# Patient Record
Sex: Male | Born: 1995 | Race: White | Hispanic: No | Marital: Single
Health system: Southern US, Community
[De-identification: ages and names within clinical notes are randomized; demographics above are authoritative.]

---

## 2016-10-25 ENCOUNTER — Emergency Department: Payer: Managed Care, Other (non HMO)

## 2016-10-25 ENCOUNTER — Encounter: Payer: Self-pay | Admitting: Radiology

## 2016-10-25 ENCOUNTER — Emergency Department
Admission: EM | Admit: 2016-10-25 | Discharge: 2016-10-25 | Disposition: A | Discharge status: Home / Self Care | Payer: Managed Care, Other (non HMO) | Attending: Emergency Medicine | Admitting: Emergency Medicine

## 2016-10-25 DIAGNOSIS — K529 Noninfective gastroenteritis and colitis, unspecified: Secondary | ICD-10-CM | POA: Insufficient documentation

## 2016-10-25 DIAGNOSIS — R1084 Generalized abdominal pain: Secondary | ICD-10-CM | POA: Diagnosis present

## 2016-10-25 LAB — URINALYSIS, COMPLETE (UACMP) WITH MICROSCOPIC
BACTERIA UA: NONE SEEN
BILIRUBIN URINE: NEGATIVE
GLUCOSE, UA: NEGATIVE mg/dL
HGB URINE DIPSTICK: NEGATIVE
KETONES UR: NEGATIVE mg/dL
LEUKOCYTES UA: NEGATIVE
NITRITE: NEGATIVE
PH: 7 (ref 5.0–8.0)
Protein, ur: NEGATIVE mg/dL
SPECIFIC GRAVITY, URINE: 1.024 (ref 1.005–1.030)
Squamous Epithelial / LPF: NONE SEEN

## 2016-10-25 LAB — CBC
HCT: 46.9 % (ref 40.0–52.0)
Hemoglobin: 16.3 g/dL (ref 13.0–18.0)
MCH: 30.7 pg (ref 26.0–34.0)
MCHC: 34.7 g/dL (ref 32.0–36.0)
MCV: 88.7 fL (ref 80.0–100.0)
Platelets: 185 10*3/uL (ref 150–440)
RBC: 5.29 MIL/uL (ref 4.40–5.90)
RDW: 12.7 % (ref 11.5–14.5)
WBC: 10.9 10*3/uL — AB (ref 3.8–10.6)

## 2016-10-25 LAB — LIPASE, BLOOD: LIPASE: 31 U/L (ref 11–51)

## 2016-10-25 LAB — COMPREHENSIVE METABOLIC PANEL
ALBUMIN: 4.8 g/dL (ref 3.5–5.0)
ALT: 19 U/L (ref 17–63)
ANION GAP: 7 (ref 5–15)
AST: 26 U/L (ref 15–41)
Alkaline Phosphatase: 67 U/L (ref 38–126)
BUN: 22 mg/dL — AB (ref 6–20)
CHLORIDE: 104 mmol/L (ref 101–111)
CO2: 29 mmol/L (ref 22–32)
Calcium: 9.3 mg/dL (ref 8.9–10.3)
Creatinine, Ser: 0.83 mg/dL (ref 0.61–1.24)
GFR calc Af Amer: >60 mL/min (ref >60)
Glucose, Bld: 107 mg/dL — ABNORMAL HIGH (ref 65–99)
POTASSIUM: 3.7 mmol/L (ref 3.5–5.1)
Sodium: 140 mmol/L (ref 135–145)
Total Bilirubin: 0.9 mg/dL (ref 0.3–1.2)
Total Protein: 7.8 g/dL (ref 6.5–8.1)

## 2016-10-25 LAB — TROPONIN I: Troponin I: <0.03 ng/mL (ref <0.03)

## 2016-10-25 MED ORDER — ONDANSETRON 4 MG PO TBDP: 4.0000 mg | ORAL_TABLET | Freq: Three times a day (TID) | ORAL | 0 refills | Status: AC | PRN

## 2016-10-25 MED ORDER — ONDANSETRON 4 MG PO TBDP
ORAL_TABLET | ORAL | Status: AC
  Filled 2016-10-25: qty 1

## 2016-10-25 MED ORDER — IOPAMIDOL (ISOVUE-300) INJECTION 61%
30.0000 mL | Freq: Once | INTRAVENOUS | Status: AC | PRN
  Administered 2016-10-25: 30 mL via ORAL

## 2016-10-25 MED ORDER — IOPAMIDOL (ISOVUE-300) INJECTION 61%
100.0000 mL | Freq: Once | INTRAVENOUS | Status: AC | PRN
  Administered 2016-10-25: 100 mL via INTRAVENOUS

## 2016-10-25 MED ORDER — ONDANSETRON HCL 4 MG/2ML IJ SOLN
4.0000 mg | Freq: Once | INTRAMUSCULAR | Status: AC
  Administered 2016-10-25: 4 mg via INTRAVENOUS

## 2016-10-25 MED ORDER — SODIUM CHLORIDE 0.9 % IV BOLUS (SEPSIS)
1000.0000 mL | Freq: Once | INTRAVENOUS | Status: AC
  Administered 2016-10-25: 1000 mL via INTRAVENOUS

## 2016-10-25 NOTE — ED Notes (Signed)
Patient reports that he has been around people sick but its been diarrhea not vomiting.

## 2016-10-25 NOTE — ED Notes (Signed)
Patient transported to X-ray 

## 2016-10-25 NOTE — ED Triage Notes (Signed)
Pt in with co abd pain x 1 hr, vomited at home and states vomited blood. Has had chills since then and feeling shaky, no hx of the same. No diarrhea, no dysuria, no hx of abd problems.

## 2016-10-25 NOTE — ED Provider Notes (Signed)
[facility] Emergency Department Provider Note   First MD Initiated Contact with Patient 10/25/16 0141     (approximate)  I have reviewed the triage vital signs and the nursing notes.   HISTORY  Chief Complaint Abdominal Pain    HPI Christopher Mooney is a 21 y.o. male presents with generalized abdominal discomfort 1 hour associated with vomiting. Patient denies any diarrhea no dysuria no fever. Patient states she's had episodes like this in the past that were secondary to gastroenteritis. Patient states his current pain score is 4 out of 10. Patient states after 3 episodes of vomiting up on his fourth episode he noted blood in the vomit that was bright red.   Past medical history Gastroenteritis There are no active problems to display for this patient.   Past surgical history None  Prior to Admission medications   Not on File    Allergies Augmentin [amoxicillin-pot clavulanate]  No family history on file.  Social History Social History  Substance Use Topics  . Smoking status: Not on file  . Smokeless tobacco: Not on file  . Alcohol use Not on file    Review of Systems Constitutional: No fever/chills Eyes: No visual changes. ENT: No sore throat. Cardiovascular: Denies chest pain. Respiratory: Denies shortness of breath. Gastrointestinal:Positive for abdominal pain and vomiting  Genitourinary: Negative for dysuria. Musculoskeletal: Negative for back pain. Skin: Negative for rash. Neurological: Negative for headaches, focal weakness or numbness.  10-point ROS otherwise negative.  ____________________________________________   PHYSICAL EXAM:  VITAL SIGNS: ED Triage Vitals  Enc Vitals Group     BP 10/25/16 0013 127/62     Pulse Rate 10/25/16 0012 86     Resp 10/25/16 0012 20     Temp 10/25/16 0012 98.2 F (36.8 C)     Temp src --      SpO2 10/25/16 0012 100 %     Weight 10/25/16 0012 140 lb (63.5 kg)     Height 10/25/16 0012  6' (1.829 m)     Head Circumference --      Peak Flow --      Pain Score 10/25/16 0012 9     Pain Loc --      Pain Edu? --      Excl. in GC? --     Constitutional: Alert and oriented. Well appearing and in no acute distress. Eyes: Conjunctivae are normal. PERRL. EOMI. Head: Atraumatic. Mouth/Throat: Mucous membranes are moist.  Oropharynx non-erythematous. Neck: No stridor.  No meningeal signs.  No cervical spine tenderness to palpation. Cardiovascular: Normal rate, regular rhythm. Good peripheral circulation. Grossly normal heart sounds. Respiratory: Normal respiratory effort.  No retractions. Lungs CTAB. Gastrointestinal: Right lower quadrant right upper quadrant and epigastric tenderness palpation. No distention.  Musculoskeletal: No lower extremity tenderness nor edema. No gross deformities of extremities. Neurologic:  Normal speech and language. No gross focal neurologic deficits are appreciated.  Skin:  Skin is warm, dry and intact. No rash noted. Psychiatric: Mood and affect are normal. Speech and behavior are normal.  ____________________________________________   LABS (all labs ordered are listed, but only abnormal results are displayed)  Labs Reviewed  CBC - Abnormal; Notable for the following:       Result Value   WBC 10.9 (*)    All other components within normal limits  COMPREHENSIVE METABOLIC PANEL - Abnormal; Notable for the following:    Glucose, Bld 107 (*)    BUN 22 (*)    All other  components within normal limits  URINALYSIS, COMPLETE (UACMP) WITH MICROSCOPIC - Abnormal; Notable for the following:    Color, Urine YELLOW (*)    APPearance CLEAR (*)    All other components within normal limits  LIPASE, BLOOD  TROPONIN I   ____________________________________________  EKG  ED ECG REPORT I, [place] N BROWN, the attending physician, personally viewed and interpreted this ECG.   Date: 10/25/2016  EKG Time: 12:25 AM  Rate: 77  Rhythm: Normal Sinus  rhythm  Axis: Normal  Intervals: Normal  ST&T Change: None  ____________________________________________  RADIOLOGY I, [place] N BROWN, personally viewed and evaluated these images (plain radiographs) as part of my medical decision making, as well as reviewing the written report by the radiologist.  Dg Chest 2 View  Result Date: 10/25/2016 CLINICAL DATA:  21 year old male with chest pain and generalized abdominal pain. EXAM: CHEST  2 VIEW COMPARISON:  None. FINDINGS: The heart size and mediastinal contours are within normal limits. Both lungs are clear. The visualized skeletal structures are unremarkable. IMPRESSION: No active cardiopulmonary disease. Electronically Signed   By: Elgie CollardArash  Radparvar M.D.   On: 10/25/2016 02:36   Ct Abdomen Pelvis W Contrast  Result Date: 10/25/2016 CLINICAL DATA:  21 year old male with chest pain and generalized abdominal pain. Hematemesis. EXAM: CT ABDOMEN AND PELVIS WITH CONTRAST TECHNIQUE: Multidetector CT imaging of the abdomen and pelvis was performed using the standard protocol following bolus administration of intravenous contrast. CONTRAST:  100mL ISOVUE-300 IOPAMIDOL (ISOVUE-300) INJECTION 61% COMPARISON:  Chest radiograph dated 10/25/2016 FINDINGS: Lower chest: The visualized lung bases are clear. No intra-abdominal free air.  Small free fluid within the pelvis. Hepatobiliary: No focal liver abnormality is seen. No gallstones, gallbladder wall thickening, or biliary dilatation. Pancreas: Unremarkable. No pancreatic ductal dilatation or surrounding inflammatory changes. Spleen: Normal in size without focal abnormality. Adrenals/Urinary Tract: Adrenal glands are unremarkable. Kidneys are normal, without renal calculi, focal lesion, or hydronephrosis. Bladder is unremarkable. Stomach/Bowel: The stomach is mildly distended. Oral contrast with air-fluid level noted within the stomach. No evidence of gastric outlet obstruction. There is mild air distention of  multiple loops of proximal small bowel measuring up to 3.2 cm in diameter. There is however opacification of the small bowel loops more distally. There is mild thickened appearance of loops of small bowel in the lower abdomen with mild engorgement of the associated mesentery. Findings most likely represent enteritis with associated degree of ileus. Correlation with clinical exam is recommended. There is no definite evidence of bowel obstruction at this time. Normal appendix. Vascular/Lymphatic: No significant vascular findings are present. No enlarged abdominal or pelvic lymph nodes. Reproductive: The prostate and seminal vesicles are grossly unremarkable. Other: None Musculoskeletal: No acute or significant osseous findings. IMPRESSION: Findings most consistent with enteritis with associated ileus. No definite evidence of bowel obstruction. Normal appendix. Correlation with clinical exam recommended. Electronically Signed   By: Elgie CollardArash  Radparvar M.D.   On: 10/25/2016 03:54      Procedures    INITIAL IMPRESSION / ASSESSMENT AND PLAN / ED COURSE  Pertinent labs & imaging results that were available during my care of the patient were reviewed by me and considered in my medical decision making (see chart for details).  Spoke with the patient length regarding CT scan findings and the importance of follow-up with gastroenterology for this reoccurs. Patient given Zofran emergency department with no recurrence of vomiting.      ____________________________________________  FINAL CLINICAL IMPRESSION(S) / ED DIAGNOSES  Final diagnoses:  Gastroenteritis  MEDICATIONS GIVEN DURING THIS VISIT:  Medications  ondansetron (ZOFRAN-ODT) 4 MG disintegrating tablet (not administered)  ondansetron (ZOFRAN) injection 4 mg (4 mg Intravenous Given 10/25/16 0016)  sodium chloride 0.9 % bolus 1,000 mL (0 mLs Intravenous Stopped 10/25/16 0329)  iopamidol (ISOVUE-300) 61 % injection 30 mL (30 mLs Oral Contrast  Given 10/25/16 0239)  iopamidol (ISOVUE-300) 61 % injection 100 mL (100 mLs Intravenous Contrast Given 10/25/16 0336)     NEW OUTPATIENT MEDICATIONS STARTED DURING THIS VISIT:  New Prescriptions   No medications on file    Modified Medications   No medications on file    Discontinued Medications   No medications on file     Note:  This document was prepared using Dragon voice recognition software and may include unintentional dictation errors.    Darci Current, MD 10/25/16 [phone]

## 2016-10-25 NOTE — ED Notes (Signed)
Patient returned from x-ray. Instructed patient on oral contrast. Will call out when complete.

## 2016-10-25 NOTE — ED Notes (Signed)
Patients mom called, states she lost contact with him when he went to imaging. This RN notified patient that his mom called and for him to call her.

## 2017-10-10 IMAGING — CT CT ABD-PELV W/ CM
2 of 4 series · 16 of 46 positions shown, 18 images · IV contrast (APPLIED)
Comparison: Chest radiograph dated 10/25/2016

CLINICAL DATA: 20-year-old male with chest pain and generalized
abdominal pain. Hematemesis.

EXAM:
CT ABDOMEN AND PELVIS WITH CONTRAST
TECHNIQUE: Multidetector CT imaging of the abdomen and pelvis was performed
using the standard protocol following bolus administration of
intravenous contrast.
CONTRAST:  100mL QFS6MH-NHH IOPAMIDOL (QFS6MH-NHH) INJECTION 61%

[Series 2: routine abd/pel with · axial · 0.74mm/px · z∈[-1019,-604]mm · 13 of 91 slices shown, 15 images]
[im 4/91  soft-tissue]
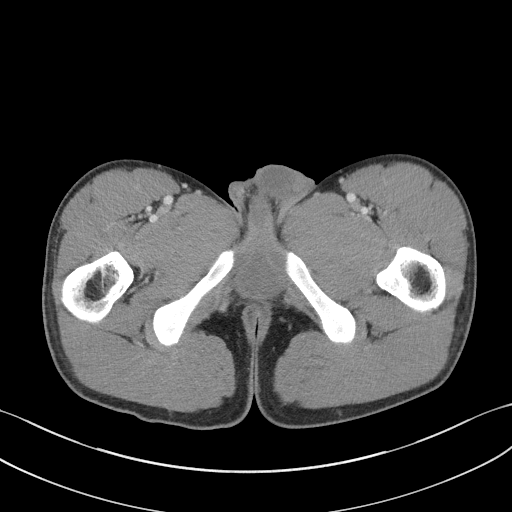
[im 4/91  bone]
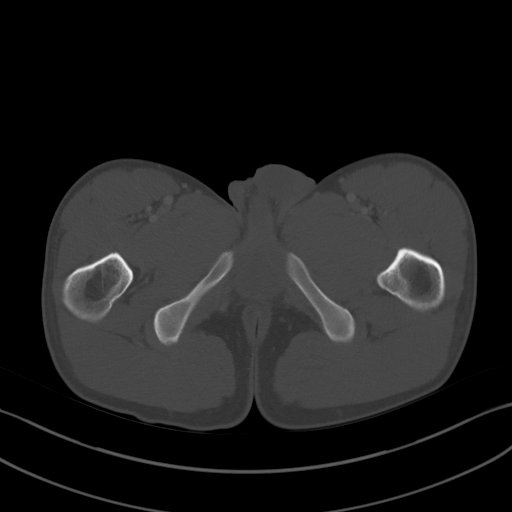
[im 12/91  soft-tissue]
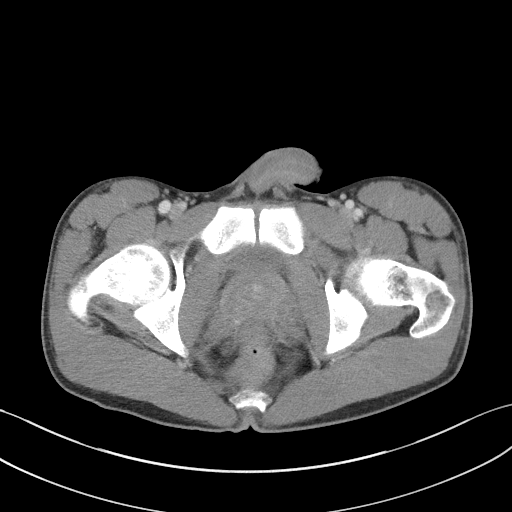
[im 20/91  soft-tissue]
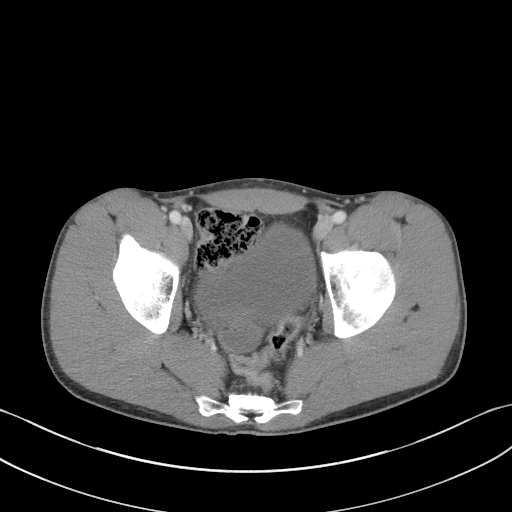
[im 24/91  soft-tissue]
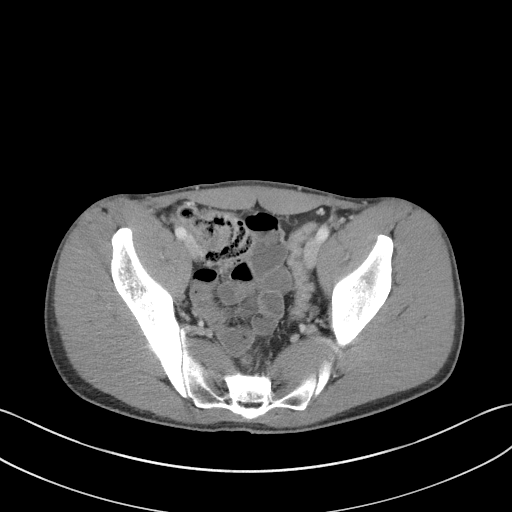
[im 32/91  soft-tissue]
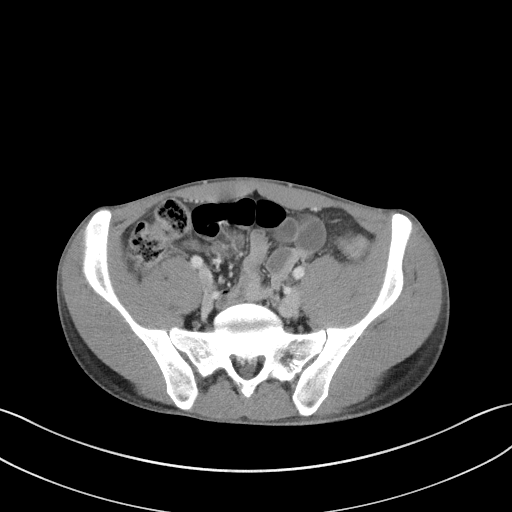
[im 40/91  soft-tissue]
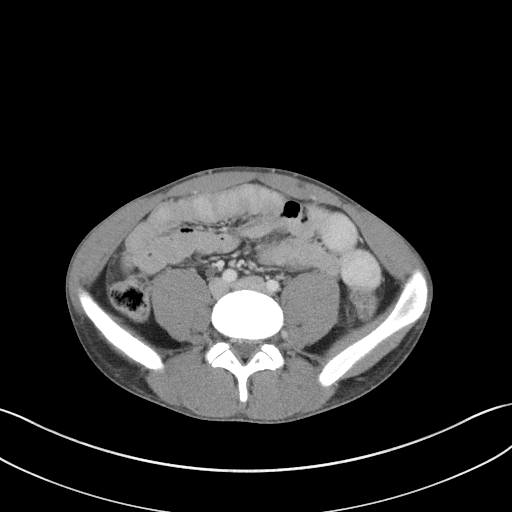
[im 47/91  soft-tissue]
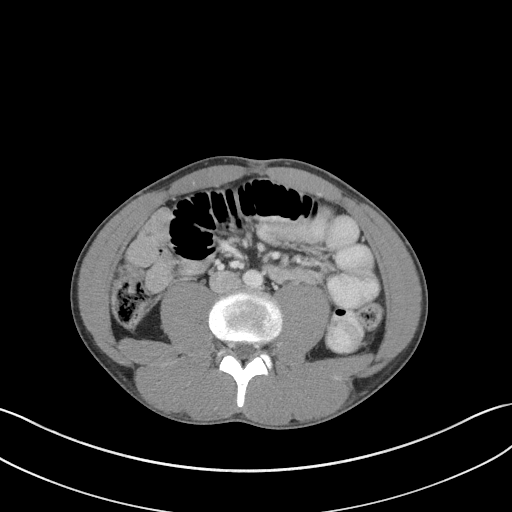
[im 51/91  soft-tissue]
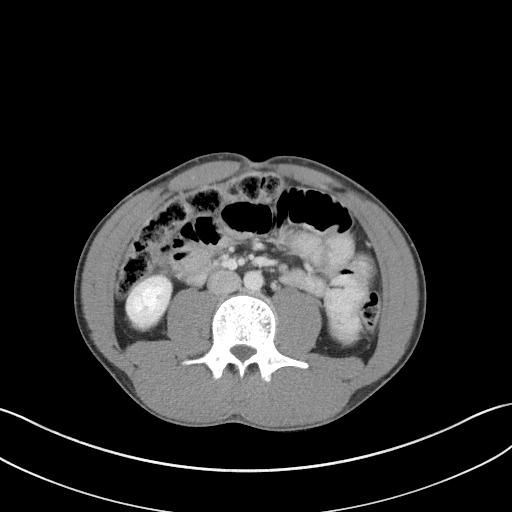
[im 59/91  soft-tissue]
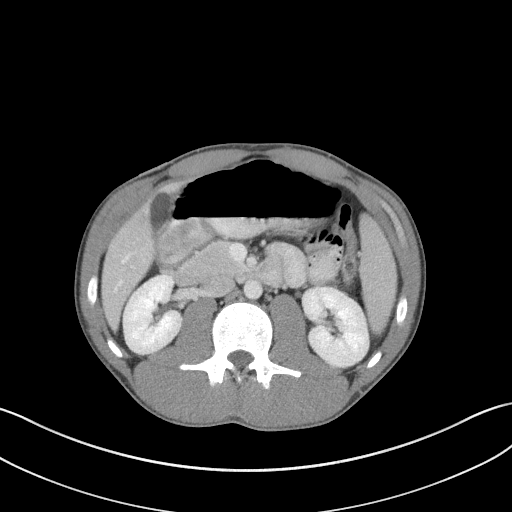
[im 59/91  bone]
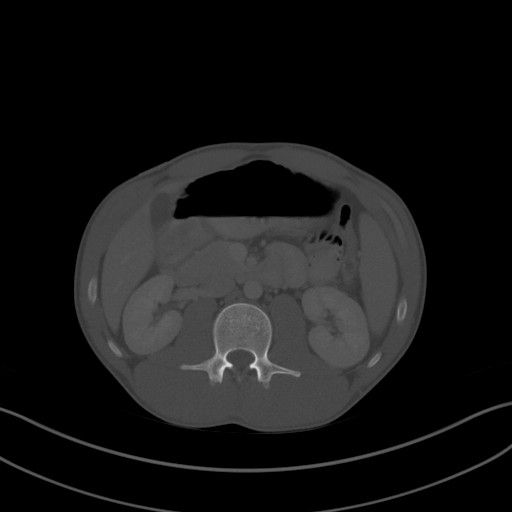
[im 67/91  soft-tissue]
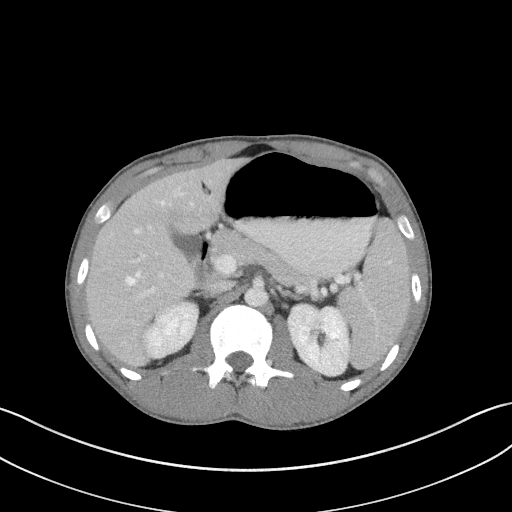
[im 71/91  soft-tissue]
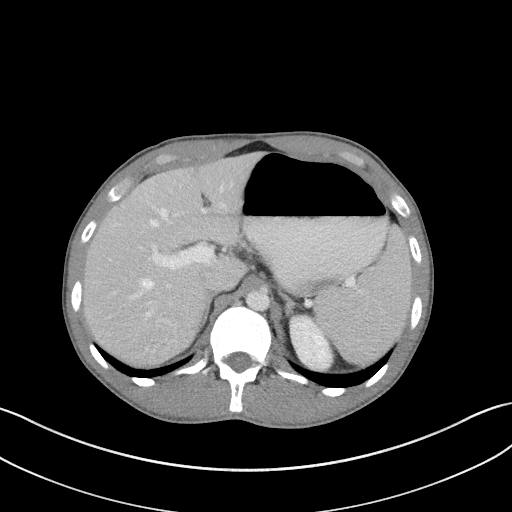
[im 79/91  soft-tissue]
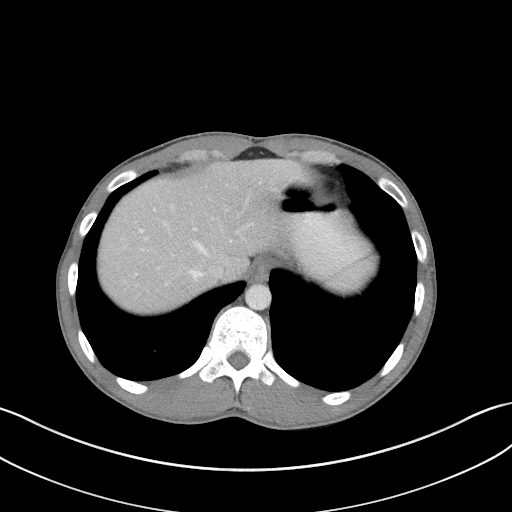
[im 87/91  soft-tissue]
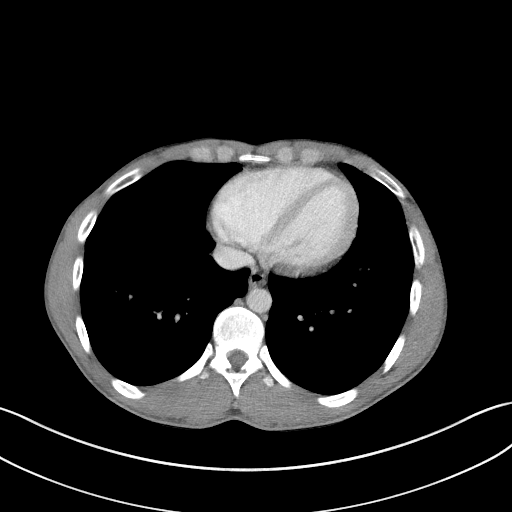

[Series 5: coronal st · coronal · 0.71mm/px · 3 of 73 slices shown]
[im 25/73  soft-tissue]
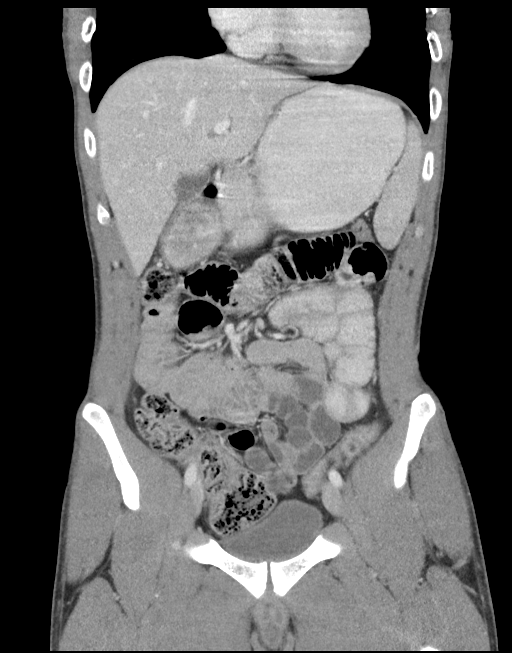
[im 33/73  soft-tissue]
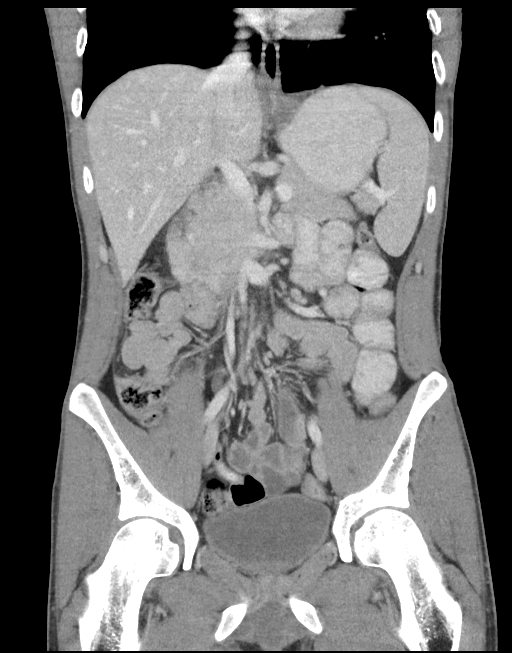
[im 41/73  soft-tissue]
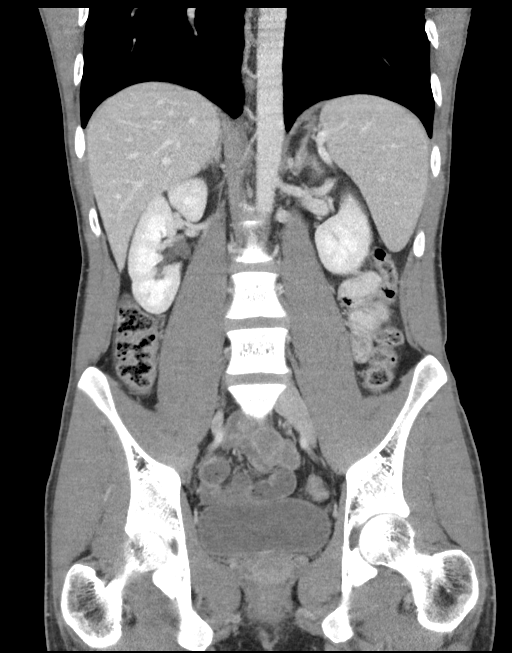

[16 of 46 positions shown; findings below may reference images not displayed]

FINDINGS: Lower chest: The visualized lung bases are clear.

No intra-abdominal free air.  Small free fluid within the pelvis.

Hepatobiliary: No focal liver abnormality is seen. No gallstones,
gallbladder wall thickening, or biliary dilatation.

Pancreas: Unremarkable. No pancreatic ductal dilatation or
surrounding inflammatory changes.

Spleen: Normal in size without focal abnormality.

Adrenals/Urinary Tract: Adrenal glands are unremarkable. Kidneys are
normal, without renal calculi, focal lesion, or hydronephrosis.
Bladder is unremarkable.

Stomach/Bowel: The stomach is mildly distended. Oral contrast with
air-fluid level noted within the stomach. No evidence of gastric
outlet obstruction. There is mild air distention of multiple loops
of proximal small bowel measuring up to 3.2 cm in diameter. There is
however opacification of the small bowel loops more distally. There
is mild thickened appearance of loops of small bowel in the lower
abdomen with mild engorgement of the associated mesentery. Findings
most likely represent enteritis with associated degree of ileus.
Correlation with clinical exam is recommended. There is no definite
evidence of bowel obstruction at this time. Normal appendix.

Vascular/Lymphatic: No significant vascular findings are present. No
enlarged abdominal or pelvic lymph nodes.

Reproductive: The prostate and seminal vesicles are grossly
unremarkable.

Other: None

Musculoskeletal: No acute or significant osseous findings.
IMPRESSION: Findings most consistent with enteritis with associated ileus. No
definite evidence of bowel obstruction. Normal appendix. Correlation
with clinical exam recommended.
